# Patient Record
Sex: Female | Born: 1980 | Race: Black or African American | Hispanic: No | Marital: Single
Health system: Southern US, Community
[De-identification: ages and names within clinical notes are randomized; demographics above are authoritative.]

---

## 2007-03-03 ENCOUNTER — Emergency Department (HOSPITAL_COMMUNITY): Admission: EM | Admit: 2007-03-03 | Discharge: 2007-03-03 | Payer: Self-pay | Admitting: Family Medicine

## 2007-03-06 ENCOUNTER — Emergency Department (HOSPITAL_COMMUNITY): Admission: EM | Admit: 2007-03-06 | Discharge: 2007-03-07 | Payer: Self-pay | Admitting: Emergency Medicine

## 2009-10-16 ENCOUNTER — Emergency Department (HOSPITAL_COMMUNITY): Admission: EM | Admit: 2009-10-16 | Discharge: 2009-10-16 | Payer: Self-pay | Admitting: Family Medicine

## 2013-05-02 ENCOUNTER — Encounter (HOSPITAL_COMMUNITY): Payer: Self-pay | Admitting: Emergency Medicine

## 2013-05-02 ENCOUNTER — Emergency Department (HOSPITAL_COMMUNITY)
Admission: EM | Admit: 2013-05-02 | Discharge: 2013-05-02 | Disposition: A | Discharge status: Home / Self Care | Payer: Managed Care, Other (non HMO) | Attending: Emergency Medicine | Admitting: Emergency Medicine

## 2013-05-02 DIAGNOSIS — Z975 Presence of (intrauterine) contraceptive device: Secondary | ICD-10-CM | POA: Insufficient documentation

## 2013-05-02 DIAGNOSIS — R000 Tachycardia, unspecified: Secondary | ICD-10-CM | POA: Insufficient documentation

## 2013-05-02 DIAGNOSIS — Z880 Allergy status to penicillin: Secondary | ICD-10-CM | POA: Insufficient documentation

## 2013-05-02 DIAGNOSIS — J111 Influenza due to unidentified influenza virus with other respiratory manifestations: Secondary | ICD-10-CM | POA: Insufficient documentation

## 2013-05-02 MED ORDER — ACETAMINOPHEN 500 MG PO TABS
1000.0000 mg | ORAL_TABLET | Freq: Once | ORAL | Status: AC
  Administered 2013-05-02: 1000 mg via ORAL
  Filled 2013-05-02: qty 2

## 2013-05-02 MED ORDER — OSELTAMIVIR PHOSPHATE 75 MG PO CAPS: 75.0000 mg | ORAL_CAPSULE | Freq: Two times a day (BID) | ORAL | Status: DC

## 2013-05-02 MED ORDER — PSEUDOEPHEDRINE HCL ER 120 MG PO TB12: 120.0000 mg | ORAL_TABLET | Freq: Two times a day (BID) | ORAL | Status: DC

## 2013-05-02 MED ORDER — IBUPROFEN 200 MG PO TABS
600.0000 mg | ORAL_TABLET | Freq: Once | ORAL | Status: AC
  Administered 2013-05-02: 600 mg via ORAL
  Filled 2013-05-02: qty 3

## 2013-05-02 NOTE — ED Notes (Signed)
Pt report flu like symptoms since last night, sts fever, body aches, cough. 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

## 2013-05-02 NOTE — Discharge Instructions (Signed)

## 2013-05-02 NOTE — ED Provider Notes (Signed)
CSN: [id number]     Arrival date & time 05/02/13  1245 History  This chart was scribed for non-physician practitioner, Molli HazardHannah Tierany Appleby-PA, working with Lyanne CoKevin M Campos, MD by Smiley HousemanFallon Davis, ED Scribe. This patient was seen in room WTR5/WTR5 and the patient's care was started at 1:50 PM.  Chief Complaint  Patient presents with  . flu like symptoms    The history is provided by the patient. No language interpreter was used.   HPI Comments: Lauren FavorsKendra N Mynhier is a 33 y.o. female who presents to the Emergency Department complaining of worsening constant flu-like symptoms that started last night.  Pt has an associated slight productive cough, generalized body aches, rhinorrhea, nasal congestion, subjective fever, and chills.  ED temperature is 100.28F.  She also complains of a HA, that is worse after she coughs.  She reports that she took Mucinex with some relief.  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     History reviewed. No pertinent past medical history. History reviewed. No pertinent past surgical history. No family history on file. History  Substance Use Topics  . Smoking status: Passive Smoke Exposure - Never Smoker  . Smokeless tobacco: Not on file  . Alcohol Use: Yes     Comment: occasionally   Review of Systems  Constitutional: Positive for fever and chills.  HENT: Positive for congestion (Nasal), rhinorrhea and sinus pressure. Negative for ear pain and sore throat.   Respiratory: Positive for cough. Negative for shortness of breath.   Cardiovascular: Negative for chest pain.  Gastrointestinal: Negative for nausea, vomiting, abdominal pain and diarrhea.  Musculoskeletal: Positive for myalgias. Negative for back pain.  Skin: Negative for color change and rash.  Neurological: Positive for headaches. Negative for syncope.  All other systems reviewed and are negative.    Allergies  Penicillins  Home Medications   Current Outpatient Rx  Name  Route   Sig  Dispense  Refill  . guaiFENesin (MUCINEX) 600 MG 12 hr tablet   Oral   Take 600 mg by mouth 2 (two) times daily as needed for cough or to loosen phlegm.         Marland Kitchen. levonorgestrel (MIRENA) 20 MCG/24HR IUD   Intrauterine   1 each by Intrauterine route once.          Triage Vitals: BP 159/91  Pulse 125  Temp(Src) 100.4 F (38 C) (Oral)  Resp 22  SpO2 100% Physical Exam  Nursing note and vitals reviewed. Constitutional: She is oriented to person, place, and time. She appears well-developed and well-nourished. No distress.  HENT:  Head: Normocephalic and atraumatic.  Right Ear: Tympanic membrane, external ear and ear canal normal.  Left Ear: Tympanic membrane, external ear and ear canal normal.  Nose: Right sinus exhibits maxillary sinus tenderness and frontal sinus tenderness. Left sinus exhibits maxillary sinus tenderness and frontal sinus tenderness.  Mouth/Throat: Uvula is midline, oropharynx is clear and moist and mucous membranes are normal. No oropharyngeal exudate, posterior oropharyngeal edema or posterior oropharyngeal erythema.  Eyes: Conjunctivae are normal.  Neck: Normal range of motion.  No nuchal rigidity or meningeal signs  Cardiovascular: Regular rhythm and normal heart sounds.  Tachycardia present.   No murmur heard. Pulmonary/Chest: Effort normal and breath sounds normal. No stridor. No respiratory distress. She has no wheezes. She has no rhonchi. She has no rales.  Abdominal: Soft. She exhibits no distension.  Musculoskeletal: Normal range of motion.  Neurological: She is alert and  oriented to person, place, and time. She has normal strength.  Skin: Skin is warm and dry. She is not diaphoretic. No erythema.  Psychiatric: She has a normal mood and affect. Her behavior is normal.    ED Course  Procedures (including critical care time) DIAGNOSTIC STUDIES: Oxygen Saturation is 100% on RA, normal by my interpretation.    COORDINATION OF CARE: 1:55 PM-Will  order Tylenol.  Will prescribe Tamiflu and Sudafed.  Patient informed of current plan of treatment and evaluation and agrees with plan.    Labs Review Labs Reviewed - No data to display Imaging Review No results found.   MDM   1. Influenza    Patient presents to ED with flu like sx that began yesterday. Fever controlled in ED with tylenol and advil. Patient was initially tachycardic in triage. Pt with pulse of 105 prior to discharge. I suspect this is related to mild dehydration and fever. Patient was given tamiflu and sudafed. Strict return instructions given. Patient is hemodynamically stable prior to discharge. Patient / Family / Caregiver informed of clinical course, understand medical decision-making process, and agree with plan.   I personally performed the services described in this documentation, which was scribed in my presence. The recorded information has been reviewed and is accurate.     Mora Bellman, PA-C 05/02/13 [phone]

## 2013-05-02 NOTE — ED Provider Notes (Signed)
Medical screening examination/treatment/procedure(s) were performed by non-physician practitioner and as supervising physician I was immediately available for consultation/collaboration.  EKG Interpretation   None         Arnika Larzelere M Vaidehi Braddy, MD 05/02/13 1554 

## 2015-07-17 ENCOUNTER — Emergency Department (HOSPITAL_COMMUNITY)
Admission: EM | Admit: 2015-07-17 | Discharge: 2015-07-17 | Disposition: A | Discharge status: Home / Self Care | Payer: Managed Care, Other (non HMO) | Source: Home / Self Care

## 2015-07-17 ENCOUNTER — Encounter (HOSPITAL_COMMUNITY): Payer: Self-pay | Admitting: *Deleted

## 2015-07-17 DIAGNOSIS — S239XXA Sprain of unspecified parts of thorax, initial encounter: Secondary | ICD-10-CM | POA: Diagnosis not present

## 2015-07-17 MED ORDER — IBUPROFEN 800 MG PO TABS: 800.0000 mg | ORAL_TABLET | Freq: Three times a day (TID) | ORAL | Status: DC

## 2015-07-17 MED ORDER — CYCLOBENZAPRINE HCL 5 MG PO TABS: 5.0000 mg | ORAL_TABLET | Freq: Three times a day (TID) | ORAL | Status: DC | PRN

## 2015-07-17 NOTE — ED Provider Notes (Signed)
CSN: [id number]     Arrival date & time 07/17/15  1936 History   None    Chief Complaint  Patient presents with  . Back Pain   (Consider location/radiation/quality/duration/timing/severity/associated sxs/prior Treatment) Patient is a 35 y.o. female presenting with back pain. The history is provided by the patient.  Back Pain Location:  Thoracic spine Quality:  Shooting and stiffness Radiates to:  Does not radiate Pain severity:  Mild Onset quality:  Gradual Duration:  3 days Progression:  Unchanged Chronicity:  New Context comment:  Works at [employer] and denies specific injury but thinks she hurt jer back moving a pt. Relieved by:  None tried Worsened by:  Nothing tried Associated symptoms: no abdominal pain, no abdominal swelling, no dysuria, no fever, no leg pain, no numbness, no paresthesias, no pelvic pain and no tingling     History reviewed. No pertinent past medical history. History reviewed. No pertinent past surgical history. History reviewed. No pertinent family history. Social History  Substance Use Topics  . Smoking status: Passive Smoke Exposure - Never Smoker  . Smokeless tobacco: None  . Alcohol Use: Yes     Comment: occasionally   OB History    No data available     Review of Systems  Constitutional: Negative.  Negative for fever.  HENT: Negative.   Respiratory: Negative.   Cardiovascular: Negative.   Gastrointestinal: Negative for abdominal pain.  Genitourinary: Negative.  Negative for dysuria and pelvic pain.  Musculoskeletal: Positive for myalgias and back pain. Negative for joint swelling and gait problem.  Skin: Negative.   Neurological: Negative for tingling, numbness and paresthesias.  All other systems reviewed and are negative.   Allergies  Penicillins  Home Medications   Prior to Admission medications   Medication Sig Start Date End Date Taking? Authorizing Provider  cyclobenzaprine (FLEXERIL) 5 MG tablet Take 1 tablet (5 mg total) by  mouth 3 (three) times daily as needed for muscle spasms. 07/17/15   Linna HoffJames D Kindl, MD  guaiFENesin (MUCINEX) 600 MG 12 hr tablet Take 600 mg by mouth 2 (two) times daily as needed for cough or to loosen phlegm.    Historical Provider, MD  ibuprofen (ADVIL,MOTRIN) 800 MG tablet Take 1 tablet (800 mg total) by mouth 3 (three) times daily. Prn back pain 07/17/15   Linna HoffJames D Kindl, MD  levonorgestrel (MIRENA) 20 MCG/24HR IUD 1 each by Intrauterine route once.    Historical Provider, MD  oseltamivir (TAMIFLU) 75 MG capsule Take 1 capsule (75 mg total) by mouth every 12 (twelve) hours. 05/02/13   Junious SilkHannah Merrell, PA-C  pseudoephedrine (SUDAFED 12 HOUR) 120 MG 12 hr tablet Take 1 tablet (120 mg total) by mouth 2 (two) times daily. 05/02/13   Junious SilkHannah Merrell, PA-C   Meds Ordered and Administered this Visit  Medications - No data to display  BP 125/69 mmHg  Pulse 80  Temp(Src) 99.2 F (37.3 C) (Oral)  Resp 14  SpO2 100% No data found.   Physical Exam  Constitutional: She is oriented to person, place, and time. She appears well-developed and well-nourished.  Neck: Normal range of motion. Neck supple.  Cardiovascular: Regular rhythm, normal heart sounds and intact distal pulses.   Pulmonary/Chest: Effort normal and breath sounds normal. She exhibits tenderness.  Abdominal: Soft. Bowel sounds are normal.  Musculoskeletal: She exhibits tenderness.       Arms: Lymphadenopathy:    She has no cervical adenopathy.  Neurological: She is alert and oriented to person, place, and time.  Skin: Skin is warm and dry.  Nursing note and vitals reviewed.   ED Course  Procedures (including critical care time)  Labs Review Labs Reviewed - No data to display  Imaging Review No results found.   Visual Acuity Review  Right Eye Distance:   Left Eye Distance:   Bilateral Distance:    Right Eye Near:   Left Eye Near:    Bilateral Near:         MDM   1. Thoracic back sprain, initial encounter         Linna Hoff, MD 07/17/15 2140

## 2015-07-17 NOTE — ED Notes (Signed)
PT  REPORTS  R  SIDED  BACK  PAIN  WITH  PAIN RADIATING  DOWN R   LEG   X  2  DAYS   - DENYS  ANY  SPECEFIC  INJURY      PT  SITTING  UPRIGHT ON THE  EXAM TABLE   SPEAKING IN  COMPLETE  SENTANCES

## 2015-07-24 ENCOUNTER — Encounter (HOSPITAL_COMMUNITY): Payer: Self-pay | Admitting: Emergency Medicine

## 2015-07-24 ENCOUNTER — Emergency Department (HOSPITAL_COMMUNITY)
Admission: EM | Admit: 2015-07-24 | Discharge: 2015-07-24 | Disposition: A | Discharge status: Home / Self Care | Payer: Managed Care, Other (non HMO) | Attending: Emergency Medicine | Admitting: Emergency Medicine

## 2015-07-24 DIAGNOSIS — Z79899 Other long term (current) drug therapy: Secondary | ICD-10-CM | POA: Diagnosis not present

## 2015-07-24 DIAGNOSIS — Y998 Other external cause status: Secondary | ICD-10-CM | POA: Insufficient documentation

## 2015-07-24 DIAGNOSIS — S199XXA Unspecified injury of neck, initial encounter: Secondary | ICD-10-CM | POA: Diagnosis present

## 2015-07-24 DIAGNOSIS — S29002A Unspecified injury of muscle and tendon of back wall of thorax, initial encounter: Secondary | ICD-10-CM | POA: Diagnosis not present

## 2015-07-24 DIAGNOSIS — Y9389 Activity, other specified: Secondary | ICD-10-CM | POA: Diagnosis not present

## 2015-07-24 DIAGNOSIS — Z880 Allergy status to penicillin: Secondary | ICD-10-CM | POA: Diagnosis not present

## 2015-07-24 DIAGNOSIS — Y92410 Unspecified street and highway as the place of occurrence of the external cause: Secondary | ICD-10-CM | POA: Insufficient documentation

## 2015-07-24 DIAGNOSIS — S0990XA Unspecified injury of head, initial encounter: Secondary | ICD-10-CM | POA: Diagnosis not present

## 2015-07-24 DIAGNOSIS — S3992XA Unspecified injury of lower back, initial encounter: Secondary | ICD-10-CM | POA: Insufficient documentation

## 2015-07-24 DIAGNOSIS — S161XXA Strain of muscle, fascia and tendon at neck level, initial encounter: Secondary | ICD-10-CM

## 2015-07-24 MED ORDER — CYCLOBENZAPRINE HCL 5 MG PO TABS: 5.0000 mg | ORAL_TABLET | Freq: Three times a day (TID) | ORAL | Status: DC | PRN

## 2015-07-24 MED ORDER — IBUPROFEN 800 MG PO TABS: 800.0000 mg | ORAL_TABLET | Freq: Three times a day (TID) | ORAL | Status: DC | PRN

## 2015-07-24 NOTE — Discharge Instructions (Signed)
°Cervical Strain and Sprain With Rehab °Cervical strain and sprain are injuries that commonly occur with "whiplash" injuries. Whiplash occurs when the neck is forcefully whipped backward or forward, such as during a motor vehicle accident or during contact sports. The muscles, ligaments, tendons, discs, and nerves of the neck are susceptible to injury when this occurs. °RISK FACTORS °Risk of having a whiplash injury increases if: °· Osteoarthritis of the spine. °· Situations that make head or neck accidents or trauma more likely. °· High-risk sports (football, rugby, wrestling, hockey, auto racing, gymnastics, diving, contact karate, or boxing). °· Poor strength and flexibility of the neck. °· Previous neck injury. °· Poor tackling technique. °· Improperly fitted or padded equipment. °SYMPTOMS  °· Pain or stiffness in the front or back of neck or both. °· Symptoms may present immediately or up to 24 hours after injury. °· Dizziness, headache, nausea, and vomiting. °· Muscle spasm with soreness and stiffness in the neck. °· Tenderness and swelling at the injury site. °PREVENTION °· Learn and use proper technique (avoid tackling with the head, spearing, and head-butting; use proper falling techniques to avoid landing on the head). °· Warm up and stretch properly before activity. °· Maintain physical fitness: °¨ Strength, flexibility, and endurance. °¨ Cardiovascular fitness. °· Wear properly fitted and padded protective equipment, such as padded soft collars, for participation in contact sports. °PROGNOSIS  °Recovery from cervical strain and sprain injuries is dependent on the extent of the injury. These injuries are usually curable in 1 week to 3 months with appropriate treatment.  °RELATED COMPLICATIONS  °· Temporary numbness and weakness may occur if the nerve roots are damaged, and this may persist until the nerve has completely healed. °· Chronic pain due to frequent recurrence of symptoms. °· Prolonged healing,  especially if activity is resumed too soon (before complete recovery). °TREATMENT  °Treatment initially involves the use of ice and medication to help reduce pain and inflammation. It is also important to perform strengthening and stretching exercises and modify activities that worsen symptoms so the injury does not get worse. These exercises may be performed at home or with a therapist. For patients who experience severe symptoms, a soft, padded collar may be recommended to be worn around the neck.  °Improving your posture may help reduce symptoms. Posture improvement includes pulling your chin and abdomen in while sitting or standing. If you are sitting, sit in a firm chair with your buttocks against the back of the chair. While sleeping, try replacing your pillow with a small towel rolled to 2 inches in diameter, or use a cervical pillow or soft cervical collar. Poor sleeping positions delay healing.  °For patients with nerve root damage, which causes numbness or weakness, the use of a cervical traction apparatus may be recommended. Surgery is rarely necessary for these injuries. However, cervical strain and sprains that are present at birth (congenital) may require surgery. °MEDICATION  °· If pain medication is necessary, nonsteroidal anti-inflammatory medications, such as aspirin and ibuprofen, or other minor pain relievers, such as acetaminophen, are often recommended. °· Do not take pain medication for 7 days before surgery. °· Prescription pain relievers may be given if deemed necessary by your caregiver. Use only as directed and only as much as you need. °HEAT AND COLD:  °· Cold treatment (icing) relieves pain and reduces inflammation. Cold treatment should be applied for 10 to 15 minutes every 2 to 3 hours for inflammation and pain and immediately after any activity that aggravates your   HEAT AND COLD:   · Cold treatment (icing) relieves pain and reduces inflammation. Cold treatment should be applied for 10 to 15 minutes every 2 to 3 hours for inflammation and pain and immediately after any activity that aggravates your symptoms. Use ice packs or an ice massage.  · Heat treatment may be used prior to performing the stretching and  strengthening activities prescribed by your caregiver, physical therapist, or athletic trainer. Use a heat pack or a warm soak.  SEEK MEDICAL CARE IF:   · Symptoms get worse or do not improve in 2 weeks despite treatment.  · New, unexplained symptoms develop (drugs used in treatment may produce side effects).  EXERCISES  RANGE OF MOTION (ROM) AND STRETCHING EXERCISES - Cervical Strain and Sprain  These exercises may help you when beginning to rehabilitate your injury. In order to successfully resolve your symptoms, you must improve your posture. These exercises are designed to help reduce the forward-head and rounded-shoulder posture which contributes to this condition. Your symptoms may resolve with or without further involvement from your physician, physical therapist or athletic trainer. While completing these exercises, remember:   · Restoring tissue flexibility helps normal motion to return to the joints. This allows healthier, less painful movement and activity.  · An effective stretch should be held for at least 20 seconds, although you may need to begin with shorter hold times for comfort.  · A stretch should never be painful. You should only feel a gentle lengthening or release in the stretched tissue.  STRETCH- Axial Extensors  · Lie on your back on the floor. You may bend your knees for comfort. Place a rolled-up hand towel or dish towel, about 2 inches in diameter, under the part of your head that makes contact with the floor.  · Gently tuck your chin, as if trying to make a "double chin," until you feel a gentle stretch at the base of your head.  · Hold __________ seconds.  Repeat __________ times. Complete this exercise __________ times per day.   STRETCH - Axial Extension   · Stand or sit on a firm surface. Assume a good posture: chest up, shoulders drawn back, abdominal muscles slightly tense, knees unlocked (if standing) and feet hip width apart.  · Slowly retract your chin so your head slides back  and your chin slightly lowers. Continue to look straight ahead.  · You should feel a gentle stretch in the back of your head. Be certain not to feel an aggressive stretch since this can cause headaches later.  · Hold for __________ seconds.  Repeat __________ times. Complete this exercise __________ times per day.  STRETCH - Cervical Side Bend   · Stand or sit on a firm surface. Assume a good posture: chest up, shoulders drawn back, abdominal muscles slightly tense, knees unlocked (if standing) and feet hip width apart.  · Without letting your nose or shoulders move, slowly tip your right / left ear to your shoulder until your feel a gentle stretch in the muscles on the opposite side of your neck.  · Hold __________ seconds.  Repeat __________ times. Complete this exercise __________ times per day.  STRETCH - Cervical Rotators   · Stand or sit on a firm surface. Assume a good posture: chest up, shoulders drawn back, abdominal muscles slightly tense, knees unlocked (if standing) and feet hip width apart.  · Keeping your eyes level with the ground, slowly turn your head until you feel a gentle stretch along   the back and opposite side of your neck.  · Hold __________ seconds.  Repeat __________ times. Complete this exercise __________ times per day.  RANGE OF MOTION - Neck Circles   · Stand or sit on a firm surface. Assume a good posture: chest up, shoulders drawn back, abdominal muscles slightly tense, knees unlocked (if standing) and feet hip width apart.  · Gently roll your head down and around from the back of one shoulder to the back of the other. The motion should never be forced or painful.  · Repeat the motion 10-20 times, or until you feel the neck muscles relax and loosen.  Repeat __________ times. Complete the exercise __________ times per day.  STRENGTHENING EXERCISES - Cervical Strain and Sprain  These exercises may help you when beginning to rehabilitate your injury. They may resolve your symptoms with or  without further involvement from your physician, physical therapist, or athletic trainer. While completing these exercises, remember:   · Muscles can gain both the endurance and the strength needed for everyday activities through controlled exercises.  · Complete these exercises as instructed by your physician, physical therapist, or athletic trainer. Progress the resistance and repetitions only as guided.  · You may experience muscle soreness or fatigue, but the pain or discomfort you are trying to eliminate should never worsen during these exercises. If this pain does worsen, stop and make certain you are following the directions exactly. If the pain is still present after adjustments, discontinue the exercise until you can discuss the trouble with your clinician.  STRENGTH - Cervical Flexors, Isometric  · Face a wall, standing about 6 inches away. Place a small pillow, a ball about 6-8 inches in diameter, or a folded towel between your forehead and the wall.  · Slightly tuck your chin and gently push your forehead into the soft object. Push only with mild to moderate intensity, building up tension gradually. Keep your jaw and forehead relaxed.  · Hold 10 to 20 seconds. Keep your breathing relaxed.  · Release the tension slowly. Relax your neck muscles completely before you start the next repetition.  Repeat __________ times. Complete this exercise __________ times per day.  STRENGTH- Cervical Lateral Flexors, Isometric   · Stand about 6 inches away from a wall. Place a small pillow, a ball about 6-8 inches in diameter, or a folded towel between the side of your head and the wall.  · Slightly tuck your chin and gently tilt your head into the soft object. Push only with mild to moderate intensity, building up tension gradually. Keep your jaw and forehead relaxed.  · Hold 10 to 20 seconds. Keep your breathing relaxed.  · Release the tension slowly. Relax your neck muscles completely before you start the next  repetition.  Repeat __________ times. Complete this exercise __________ times per day.  STRENGTH - Cervical Extensors, Isometric   · Stand about 6 inches away from a wall. Place a small pillow, a ball about 6-8 inches in diameter, or a folded towel between the back of your head and the wall.  · Slightly tuck your chin and gently tilt your head back into the soft object. Push only with mild to moderate intensity, building up tension gradually. Keep your jaw and forehead relaxed.  · Hold 10 to 20 seconds. Keep your breathing relaxed.  · Release the tension slowly. Relax your neck muscles completely before you start the next repetition.  Repeat __________ times. Complete this exercise __________ times per day.    All of your joints have less wear and tear when properly supported by a spine with good posture. This means you will experience a healthier, less painful body.  Correct posture must be practiced with all of your activities, especially prolonged sitting and standing. Correct posture is as important when doing repetitive low-stress activities (typing) as it is when doing a single heavy-load activity (lifting). PROLONGED STANDING WHILE SLIGHTLY LEANING FORWARD When completing a task that requires you to lean forward while standing in one  place for a long time, place either foot up on a stationary 2- to 4-inch high object to help maintain the best posture. When both feet are on the ground, the low back tends to lose its slight inward curve. If this curve flattens (or becomes too large), then the back and your other joints will experience too much stress, fatigue more quickly, and can cause pain.  RESTING POSITIONS Consider which positions are most painful for you when choosing a resting position. If you have pain with flexion-based activities (sitting, bending, stooping, squatting), choose a position that allows you to rest in a less flexed posture. You would want to avoid curling into a fetal position on your side. If your pain worsens with extension-based activities (prolonged standing, working overhead), avoid resting in an extended position such as sleeping on your stomach. Most people will find more comfort when they rest with their spine in a more neutral position, neither too rounded nor too arched. Lying on a non-sagging bed on your side with a pillow between your knees, or on your back with a pillow under your knees will often provide some relief. Keep in mind, being in any one position for a prolonged period of time, no matter how correct your posture, can still lead to stiffness. WALKING Walk with an upright posture. Your ears, shoulders, and hips should all line up. OFFICE WORK When working at a desk, create an environment that supports good, upright posture. Without extra support, muscles fatigue and lead to excessive strain on joints and other tissues. CHAIR:  A chair should be able to slide under your desk when your back makes contact with the back of the chair. This allows you to work closely.  The chair's height should allow your eyes to be level with the upper part of your monitor and your hands to be slightly lower than your elbows.  Body position:  Your feet should make contact with the floor. If this is not  possible, use a foot rest.  Keep your ears over your shoulders. This will reduce stress on your neck and low back.   This information is not intended to replace advice given to you by your health care provider. Make sure you discuss any questions you have with your health care provider.   Document Released: 04/07/2005 Document Revised: 04/28/2014 Document Reviewed: 07/20/2008 Elsevier Interactive Patient Education 2016 [employer]  Cryotherapy Cryotherapy is when you put ice on your injury. Ice helps lessen pain and puffiness (swelling) after an injury. Ice works the best when you start using it in the first 24 to 48 hours after an injury. HOME CARE  Put a dry or damp towel between the ice pack and your skin.  You may press gently on the ice pack.  Leave the ice on for no more than 10 to 20 minutes at a time.  Check your skin after 5 minutes to make sure your skin is okay.  Rest at least 20 minutes between ice pack  uses.  Stop using ice when your skin loses feeling (numbness).  Do not use ice on someone who cannot tell you when it hurts. This includes small children and people with memory problems (dementia). GET HELP RIGHT AWAY IF:  You have white spots on your skin.  Your skin turns blue or pale.  Your skin feels waxy or hard.  Your puffiness gets worse. MAKE SURE YOU:   Understand these instructions.  Will watch your condition.  Will get help right away if you are not doing well or get worse.   This information is not intended to replace advice given to you by your health care provider. Make sure you discuss any questions you have with your health care provider.   Document Released: 09/24/2007 Document Revised: 06/30/2011 Document Reviewed: 11/28/2010 Elsevier Interactive Patient Education [employer]
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
## 2015-07-24 NOTE — ED Notes (Signed)
PT DISCHARGED. INSTRUCTIONS AND PRESCRIPTIONS GIVEN. AAOX3. PT IN NO APPARENT DISTRESS. THE OPPORTUNITY TO ASK QUESTIONS WAS PROVIDED. 

## 2015-12-03 ENCOUNTER — Ambulatory Visit (HOSPITAL_COMMUNITY)
Admission: EM | Admit: 2015-12-03 | Discharge: 2015-12-03 | Disposition: A | Discharge status: Home / Self Care | Payer: Managed Care, Other (non HMO) | Attending: Family Medicine | Admitting: Family Medicine

## 2015-12-03 ENCOUNTER — Encounter (HOSPITAL_COMMUNITY): Payer: Self-pay | Admitting: Emergency Medicine

## 2015-12-03 DIAGNOSIS — J039 Acute tonsillitis, unspecified: Secondary | ICD-10-CM | POA: Insufficient documentation

## 2015-12-03 DIAGNOSIS — Z880 Allergy status to penicillin: Secondary | ICD-10-CM | POA: Insufficient documentation

## 2015-12-03 DIAGNOSIS — J029 Acute pharyngitis, unspecified: Secondary | ICD-10-CM | POA: Diagnosis present

## 2015-12-03 LAB — POCT RAPID STREP A: Streptococcus, Group A Screen (Direct): NEGATIVE

## 2015-12-03 MED ORDER — AZITHROMYCIN 250 MG PO TABS: 250.0000 mg | ORAL_TABLET | Freq: Every day | ORAL | 0 refills | Status: DC

## 2015-12-03 NOTE — ED Triage Notes (Signed)
[facility] a [facility] sore throat with fever that started 2 days ago.
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
## 2015-12-06 LAB — CULTURE, GROUP A STREP (THRC)

## 2016-04-10 ENCOUNTER — Encounter (HOSPITAL_COMMUNITY): Payer: Self-pay | Admitting: Emergency Medicine

## 2016-04-10 ENCOUNTER — Emergency Department (HOSPITAL_COMMUNITY)
Admission: EM | Admit: 2016-04-10 | Discharge: 2016-04-10 | Disposition: A | Discharge status: Home / Self Care | Payer: Managed Care, Other (non HMO) | Attending: Emergency Medicine | Admitting: Emergency Medicine

## 2016-04-10 ENCOUNTER — Emergency Department (HOSPITAL_COMMUNITY): Payer: Managed Care, Other (non HMO)

## 2016-04-10 DIAGNOSIS — L03011 Cellulitis of right finger: Secondary | ICD-10-CM

## 2016-04-10 DIAGNOSIS — Z7722 Contact with and (suspected) exposure to environmental tobacco smoke (acute) (chronic): Secondary | ICD-10-CM | POA: Insufficient documentation

## 2016-04-10 MED ORDER — BUPIVACAINE HCL (PF) 0.5 % IJ SOLN
10.0000 mL | Freq: Once | INTRAMUSCULAR | Status: AC
  Administered 2016-04-10: 10 mL
  Filled 2016-04-10: qty 30

## 2016-04-10 MED ORDER — LIDOCAINE HCL 2 % IJ SOLN
5.0000 mL | Freq: Once | INTRAMUSCULAR | Status: AC
  Administered 2016-04-10: 100 mg via INTRADERMAL
  Filled 2016-04-10: qty 20

## 2016-04-10 MED ORDER — NAPROXEN 500 MG PO TABS: 500.0000 mg | ORAL_TABLET | Freq: Two times a day (BID) | ORAL | 0 refills | Status: DC

## 2016-04-10 MED ORDER — HYDROCODONE-ACETAMINOPHEN 5-325 MG PO TABS: 1.0000 | ORAL_TABLET | Freq: Four times a day (QID) | ORAL | 0 refills | Status: DC | PRN

## 2016-04-10 MED ORDER — BACITRACIN ZINC 500 UNIT/GM EX OINT
TOPICAL_OINTMENT | Freq: Once | CUTANEOUS | Status: AC
  Administered 2016-04-10: 1 via TOPICAL

## 2016-04-10 MED ORDER — CEPHALEXIN 500 MG PO CAPS: 500.0000 mg | ORAL_CAPSULE | Freq: Four times a day (QID) | ORAL | 0 refills | Status: DC

## 2016-04-10 NOTE — ED Triage Notes (Signed)
Patient here with complaints of right middle finger pain. States that she injured it at work, able to get acrylic nail off with no relief. Pain 7/10.

## 2016-04-10 NOTE — Discharge Instructions (Signed)
You have what is suspected to be an infection in the finger behind the nail. This was drained in the ED. Please take all of your antibiotics until finished!   You may develop abdominal discomfort or diarrhea from the antibiotic.  You may help offset this with probiotics which you can buy or get in yogurt. Do not eat or take the probiotics until 2 hours after your antibiotic.  Keep your wound clean and dry. Follow the attached instructions for further care management details. Return to ED should symptoms worsen.

## 2016-04-10 NOTE — ED Provider Notes (Addendum)
[facility]-EMERGENCY DEPT Provider Note   CSN: [id number] Arrival date & time: 04/10/16  1552  By signing my name below, I, Placido SouLogan Joldersma, attest that this documentation has been prepared under the direction and in the presence of Shawn C. Joy, PA-C. Electronically Signed: Placido SouLogan Joldersma, ED Scribe. 04/10/16. 4:49 PM.   History   Chief Complaint Chief Complaint  Patient presents with  . Finger Injury    HPI HPI Comments: Lauren Lee is a 35 y.o. female who presents to the Emergency Department complaining of sudden onset, moderate, right third finger pain x 6 days. She does not know if the pain is from an infection or from trauma, but first noticed the pain after she accidentally struck the finger on a coffee table. Pt has synthetic nails on her fingers which were placed two days before her finger was struck. She removed the synthetic nail on the affected finger s/p the onset of her pain. She has taken ibuprofen and applied ice to the finger w/o relief. She reports associated swelling, warmth, and redness in the finger. Patient denies fever/chills, neuro deficits, or any other complaints.      The history is provided by the patient and medical records. No language interpreter was used.    History reviewed. No pertinent past medical history.  There are no active problems to display for this patient.   History reviewed. No pertinent surgical history.  OB History    No data available       Home Medications    Prior to Admission medications   Medication Sig Start Date End Date Taking? Authorizing Provider  azithromycin (ZITHROMAX) 250 MG tablet Take 1 tablet (250 mg total) by mouth daily. Take first 2 tablets together, then 1 every day until finished. 12/03/15   Tharon AquasFrank C Patrick, PA  cephALEXin (KEFLEX) 500 MG capsule Take 1 capsule (500 mg total) by mouth 4 (four) times daily. 04/10/16   Shawn C Joy, PA-C  cyclobenzaprine (FLEXERIL) 5 MG tablet Take 1 tablet (5 mg total) by mouth  3 (three) times daily as needed for muscle spasms. 07/24/15   Earley FavorGail Schulz, NP  guaiFENesin (MUCINEX) 600 MG 12 hr tablet Take 600 mg by mouth 2 (two) times daily as needed for cough or to loosen phlegm.    Historical Provider, MD  HYDROcodone-acetaminophen (NORCO/VICODIN) 5-325 MG tablet Take 1 tablet by mouth every 6 (six) hours as needed. 04/10/16   Shawn C Joy, PA-C  ibuprofen (ADVIL,MOTRIN) 800 MG tablet Take 1 tablet (800 mg total) by mouth every 8 (eight) hours as needed. Prn back pain 07/24/15   Earley FavorGail Schulz, NP  levonorgestrel (MIRENA) 20 MCG/24HR IUD 1 each by Intrauterine route once.    Historical Provider, MD  naproxen (NAPROSYN) 500 MG tablet Take 1 tablet (500 mg total) by mouth 2 (two) times daily. 04/10/16   Shawn C Joy, PA-C  oseltamivir (TAMIFLU) 75 MG capsule Take 1 capsule (75 mg total) by mouth every 12 (twelve) hours. 05/02/13   Junious SilkHannah Merrell, PA-C  pseudoephedrine (SUDAFED 12 HOUR) 120 MG 12 hr tablet Take 1 tablet (120 mg total) by mouth 2 (two) times daily. 05/02/13   Junious SilkHannah Merrell, PA-C    Family History No family history on file.  Social History Social History  Substance Use Topics  . Smoking status: Passive Smoke Exposure - Never Smoker  . Smokeless tobacco: Never Used  . Alcohol use Yes     Comment: occasionally     Allergies   Penicillins  Review of Systems Review of Systems  Musculoskeletal: Positive for arthralgias and joint swelling.  Skin: Positive for color change.  Neurological: Negative for weakness and numbness.   Physical Exam Updated Vital Signs BP 124/94 (BP Location: Left Arm)   Pulse 64   Temp 98.3 F (36.8 C) (Oral)   Resp 16   SpO2 100%   Physical Exam  Constitutional: She appears well-developed and well-nourished. No distress.  HENT:  Head: Normocephalic and atraumatic.  Eyes: Conjunctivae are normal.  Neck: Neck supple.  Cardiovascular: Normal rate, regular rhythm and intact distal pulses.   Pulmonary/Chest: Effort normal.    Musculoskeletal: She exhibits tenderness.  Tenderness to the soft tissue surrounding the nail of the right middle finger. Area behind the cuticle is swollen, erythematous, fluctuant, and warm to the touch.  Flexion and extension intact at the DIP and PIP joint.  Neurological: She is alert.  Sensation intact to the distal end of the right middle finger. Strength 5 out of 5 with flexion and extension.  Skin: Skin is warm and dry. Capillary refill takes less than 2 seconds. She is not diaphoretic. There is erythema.  Psychiatric: She has a normal mood and affect. Her behavior is normal.  Nursing note and vitals reviewed.  ED Treatments / Results  Labs (all labs ordered are listed, but only abnormal results are displayed) Labs Reviewed - No data to display  EKG  EKG Interpretation None       Radiology Dg Finger Middle Right  Result Date: 04/10/2016 CLINICAL DATA:  Right mid and distal middle finger pain for 6 days after hitting it on a chair. EXAM: RIGHT MIDDLE FINGER 2+V COMPARISON:  None. FINDINGS: There is no evidence of fracture or dislocation. There is no evidence of arthropathy or other focal bone abnormality. Soft tissues are unremarkable. IMPRESSION: Negative. Electronically Signed   By: Kennith CenterEric  Mansell M.D.   On: 04/10/2016 16:45   Procedures .Nerve Block Date/Time: 04/10/2016 4:49 PM Performed by: Anselm PancoastJOY, SHAWN C Authorized by: Anselm PancoastJOY, SHAWN C   Consent:    Consent obtained:  Verbal   Consent given by:  Patient   Risks discussed:  Unsuccessful block Indications:    Indications:  Procedural anesthesia Location:    Body area:  Upper extremity   Upper extremity nerve blocked: Digital.   Laterality:  Right Pre-procedure details:    Skin preparation:  Povidone-iodine Procedure details (see MAR for exact dosages):    Block needle gauge:  27 G   Anesthetic injected:  Bupivacaine 0.5% w/o epi and lidocaine 2% w/o epi Post-procedure details:    Outcome:  Anesthesia achieved    Patient tolerance of procedure:  Tolerated well, no immediate complications Drain paronychia Date/Time: 04/10/2016 4:51 PM Performed by: Anselm PancoastJOY, SHAWN C Authorized by: Harolyn RutherfordJOY, SHAWN C  Consent: Verbal consent obtained. Risks and benefits: risks, benefits and alternatives were discussed Consent given by: patient Patient consent: the patient's understanding of the procedure matches consent given Patient identity confirmed: verbally with patient Preparation: Patient was prepped and draped in the usual sterile fashion. Local anesthesia used: yes Anesthesia: digital block  Anesthesia: Local anesthesia used: yes Local Anesthetic: bupivacaine 0.5% without epinephrine and lidocaine 2% without epinephrine  Sedation: Patient sedated: no Patient tolerance: Patient tolerated the procedure well with no immediate complications Comments: I&D of the paronychia yielded scant purulent and bloody drainage.     DIAGNOSTIC STUDIES: Oxygen Saturation is 100% on RA, normal by my interpretation.    COORDINATION OF CARE: 4:48 PM Discussed next  steps with pt. Pt verbalized understanding and is agreeable with the plan.    Medications Ordered in ED Medications  bupivacaine (MARCAINE) 0.5 % injection 10 mL (10 mLs Infiltration Given by Other 04/10/16 1652)  lidocaine (XYLOCAINE) 2 % (with pres) injection 100 mg (100 mg Intradermal Given by Other 04/10/16 1651)  bacitracin ointment (1 application Topical Given 04/10/16 1739)     Initial Impression / Assessment and Plan / ED Course  I have reviewed the triage vital signs and the nursing notes.  Pertinent labs & imaging results that were available during my care of the patient were reviewed by me and considered in my medical decision making (see chart for details).  Clinical Course     Patient presents with finger pain. Paronychia suspected. Home care and return precautions discussed. Patient voiced understanding of all instructions and is comfortable with  discharge.     I personally performed the services described in this documentation, which was scribed in my presence. The recorded information has been reviewed and is accurate. Final Clinical Impressions(s) / ED Diagnoses   Final diagnoses:  Paronychia of right middle finger    New Prescriptions Discharge Medication List as of 04/10/2016  5:35 PM    START taking these medications   Details  cephALEXin (KEFLEX) 500 MG capsule Take 1 capsule (500 mg total) by mouth 4 (four) times daily., Starting Thu 04/10/2016, Print    HYDROcodone-acetaminophen (NORCO/VICODIN) 5-325 MG tablet Take 1 tablet by mouth every 6 (six) hours as needed., Starting Thu 04/10/2016, Print    naproxen (NAPROSYN) 500 MG tablet Take 1 tablet (500 mg total) by mouth 2 (two) times daily., Starting Thu 04/10/2016, Print         Anselm Pancoast, PA-C 04/11/16 1138    Pricilla Loveless, MD 04/12/16 2352    Anselm Pancoast, PA-C 04/15/16 2002    Pricilla Loveless, MD 04/17/16 [phone]

## 2017-07-30 ENCOUNTER — Emergency Department (HOSPITAL_COMMUNITY)
Admission: EM | Admit: 2017-07-30 | Discharge: 2017-07-30 | Disposition: A | Discharge status: Home / Self Care | Payer: PRIVATE HEALTH INSURANCE | Attending: Emergency Medicine | Admitting: Emergency Medicine

## 2017-07-30 ENCOUNTER — Encounter (HOSPITAL_COMMUNITY): Payer: Self-pay

## 2017-07-30 DIAGNOSIS — R0981 Nasal congestion: Secondary | ICD-10-CM

## 2017-07-30 DIAGNOSIS — Z79899 Other long term (current) drug therapy: Secondary | ICD-10-CM | POA: Diagnosis not present

## 2017-07-30 DIAGNOSIS — Z7722 Contact with and (suspected) exposure to environmental tobacco smoke (acute) (chronic): Secondary | ICD-10-CM | POA: Insufficient documentation

## 2017-07-30 NOTE — ED Triage Notes (Signed)
Pt presents with c/o nasal congestion and facial pain because of the congestion for the past 3 days. Pt reports she has been taking OTC meds with no relief.

## 2017-07-30 NOTE — ED Provider Notes (Signed)
[facility]-EMERGENCY DEPT Provider Note   CSN: [id number] Arrival date & time: 07/30/17  0025     History   Chief Complaint Chief Complaint  Patient presents with  . Nasal Congestion    HPI Lauren Lee is a 37 y.o. female.  HPI 37 year old African-American female with no pertinent past medical history presents to the emergency department today for evaluation of nasal congestion.  Patient states for the past 3 days she has had significant nasal congestion, rhinorrhea, sinus pressure and pain.  Patient states that this is likely secondary to allergies.  Patient states he has taken several over-the-counter cold and flu medication along with allergy medications with no relief.  Patient states that it is difficult to breathe out of her nose due to the congestion.  Does report some bilateral ear fullness.  She denies any other associated symptoms including fever, chills, sore throat, headache.  No known sick contacts. History reviewed. No pertinent past medical history.  There are no active problems to display for this patient.   History reviewed. No pertinent surgical history.   OB History   None      Home Medications    Prior to Admission medications   Medication Sig Start Date End Date Taking? Authorizing Provider  azithromycin (ZITHROMAX) 250 MG tablet Take 1 tablet (250 mg total) by mouth daily. Take first 2 tablets together, then 1 every day until finished. 12/03/15   Tharon Aquas, PA  cephALEXin (KEFLEX) 500 MG capsule Take 1 capsule (500 mg total) by mouth 4 (four) times daily. 04/10/16   Joy, Shawn C, PA-C  cyclobenzaprine (FLEXERIL) 5 MG tablet Take 1 tablet (5 mg total) by mouth 3 (three) times daily as needed for muscle spasms. 07/24/15   Earley Favor, NP  guaiFENesin (MUCINEX) 600 MG 12 hr tablet Take 600 mg by mouth 2 (two) times daily as needed for cough or to loosen phlegm.    [provider]  HYDROcodone-acetaminophen  (NORCO/VICODIN) 5-325 MG tablet Take 1 tablet by mouth every 6 (six) hours as needed. 04/10/16   Joy, Shawn C, PA-C  ibuprofen (ADVIL,MOTRIN) 800 MG tablet Take 1 tablet (800 mg total) by mouth every 8 (eight) hours as needed. Prn back pain 07/24/15   Earley Favor, NP  levonorgestrel [facility]) 20 MCG/24HR IUD 1 each by Intrauterine route once.    [provider]  naproxen (NAPROSYN) 500 MG tablet Take 1 tablet (500 mg total) by mouth 2 (two) times daily. 04/10/16   Joy, Shawn C, PA-C  oseltamivir (TAMIFLU) 75 MG capsule Take 1 capsule (75 mg total) by mouth every 12 (twelve) hours. 05/02/13   Junious Silk, PA-C  pseudoephedrine (SUDAFED 12 HOUR) 120 MG 12 hr tablet Take 1 tablet (120 mg total) by mouth 2 (two) times daily. 05/02/13   Junious Silk, PA-C    Family History History reviewed. No pertinent family history.  Social History Social History   Tobacco Use  . Smoking status: Passive Smoke Exposure - Never Smoker  . Smokeless tobacco: Never Used  Substance Use Topics  . Alcohol use: Yes    Comment: occasionally  . Drug use: No     Allergies   Penicillins   Review of Systems Review of Systems  All other systems reviewed and are negative.    Physical Exam Updated Vital Signs BP 137/85 (BP Location: Left Arm)   Pulse 73   Temp 98.3 F (36.8 C) (Oral)   Resp 18   SpO2 97%  Physical Exam  Constitutional: She appears well-developed and well-nourished. No distress.  HENT:  Head: Normocephalic and atraumatic.  Right Ear: Tympanic membrane, external ear and ear canal normal.  Left Ear: Tympanic membrane, external ear and ear canal normal.  Nose: Mucosal edema and rhinorrhea present. Right sinus exhibits maxillary sinus tenderness and frontal sinus tenderness. Left sinus exhibits maxillary sinus tenderness and frontal sinus tenderness.  Mouth/Throat: Uvula is midline, oropharynx is clear and moist and mucous membranes are normal.  Eyes: Conjunctivae are normal.  Right eye exhibits no discharge. Left eye exhibits no discharge. No scleral icterus.  Neck: Normal range of motion. Neck supple.  Pulmonary/Chest: Effort normal and breath sounds normal. No stridor. No respiratory distress. She has no wheezes. She has no rales. She exhibits no tenderness.  Musculoskeletal: Normal range of motion.  Neurological: She is alert.  Skin: Skin is warm and dry. Capillary refill takes less than 2 seconds. No pallor.  Psychiatric: Her behavior is normal. Judgment and thought content normal.  Nursing note and vitals reviewed.    ED Treatments / Results  Labs (all labs ordered are listed, but only abnormal results are displayed) Labs Reviewed - No data to display  EKG None  Radiology No results found.  Procedures Procedures (including critical care time)  Medications Ordered in ED Medications - No data to display   Initial Impression / Assessment and Plan / ED Course  I have reviewed the triage vital signs and the nursing notes.  Pertinent labs & imaging results that were available during my care of the patient were reviewed by me and considered in my medical decision making (see chart for details).     Patient presents to the emergency department today for evaluation of nasal congestion.  Ongoing for 3 days.  Reports likely due to allergies.  She is tried over-the-counter medication with no relief.  On exam patient overall well-appearing and nontoxic.  Vital signs are reassuring.  She does have significant nasal congestion with some sinus pressure to palpation.  Patient denies any associated fevers.  Symptoms secondary to allergic rhinitis versus sinusitis.  No indication for antibiotics at this time.  I discussed using Flonase, antihistamines and nasal decongestants.  Also recommended nasal rinses.  Pt is hemodynamically stable, in NAD, & able to ambulate in the ED. Evaluation does not show pathology that would require ongoing emergent intervention or  inpatient treatment. I explained the diagnosis to the patient. Pain has been managed & has no complaints prior to dc. Pt is comfortable with above plan and is stable for discharge at this time. All questions were answered prior to disposition. Strict return precautions for f/u to the ED were discussed. Encouraged follow up with PCP.   Final Clinical Impressions(s) / ED Diagnoses   Final diagnoses:  Nasal congestion    ED Discharge Orders    None       Wallace KellerLeaphart, Jashad Depaula T, PA-C 07/30/17 [phone]    Shaune PollackIsaacs, Cameron, MD 07/30/17 [phone]

## 2017-10-20 ENCOUNTER — Encounter (HOSPITAL_COMMUNITY): Payer: Self-pay | Admitting: Emergency Medicine

## 2017-10-20 ENCOUNTER — Ambulatory Visit (HOSPITAL_COMMUNITY)
Admission: EM | Admit: 2017-10-20 | Discharge: 2017-10-20 | Disposition: A | Discharge status: Home / Self Care | Payer: Worker's Compensation | Attending: Physician Assistant | Admitting: Physician Assistant

## 2017-10-20 DIAGNOSIS — M545 Low back pain, unspecified: Secondary | ICD-10-CM

## 2017-10-20 MED ORDER — NAPROXEN 500 MG PO TABS: 500.0000 mg | ORAL_TABLET | Freq: Two times a day (BID) | ORAL | 0 refills | Status: AC

## 2017-10-20 MED ORDER — CYCLOBENZAPRINE HCL 10 MG PO TABS: 5.0000 mg | ORAL_TABLET | Freq: Three times a day (TID) | ORAL | 0 refills | Status: DC | PRN

## 2017-10-20 MED ORDER — HYDROCODONE-ACETAMINOPHEN 5-325 MG PO TABS: 1.0000 | ORAL_TABLET | Freq: Four times a day (QID) | ORAL | 0 refills | Status: DC | PRN

## 2017-10-20 NOTE — ED Provider Notes (Signed)
10/20/2017 5:33 PM   DOB: [birth year] / MRN: [id number]  SUBJECTIVE:  Lauren FavorsKendra N Lee is a 37 y.o. female presenting for left-sided lumbar back pain present for 3 days and worsening.  Position changes make the pain worse.  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  She denies weakness in the legs, paresthesia, saddle paresthesia.  She is currently taking birth control.  She is allergic to penicillins.   She  has no past medical history on file.    She  reports that she is a non-smoker but has been exposed to tobacco smoke. She has never used smokeless tobacco. She reports that she drinks alcohol. She reports that she does not use drugs. She  has no sexual activity history on file. The patient  has no past surgical history on file.  Her family history is not on file.  ROS per HPI  OBJECTIVE:  BP (!) 138/91   Pulse 82   Temp 99.2 F (37.3 C) (Temporal)   Resp 16   Wt 157 lb (71.2 kg)   SpO2 98%   Wt Readings from Last 3 Encounters:  10/20/17 157 lb (71.2 kg)   Temp Readings from Last 3 Encounters:  10/20/17 99.2 F (37.3 C) (Temporal)  07/30/17 98.3 F (36.8 C) (Oral)  04/10/16 98.3 F (36.8 C) (Oral)   BP Readings from Last 3 Encounters:  10/20/17 (!) 138/91  07/30/17 137/85  04/10/16 124/94   Pulse Readings from Last 3 Encounters:  10/20/17 82  07/30/17 73  04/10/16 64    Physical Exam  Constitutional: She is oriented to person, place, and time. She appears well-developed.  Eyes: Pupils are equal, round, and reactive to light. EOM are normal.  Cardiovascular: Normal rate.  Pulmonary/Chest: Effort normal.  Abdominal: She exhibits no distension.  Musculoskeletal: Normal range of motion. She exhibits tenderness ( per HPI left-sided lumbar paraspinal tenderness.  No mass or rash.  Reflexes 2+ at the Achilles tendons.  Gait antalgic but otherwise normal.).  Neurological: She is alert and oriented to person, place, and time. No cranial nerve deficit.   Skin: Skin is warm and dry. She is not diaphoretic.  Psychiatric: She has a normal mood and affect.  Vitals reviewed.   No results found for this or any previous visit (from the past 72 hour(s)).  No results found.  ASSESSMENT AND PLAN:   Lumbar back pain:Uncomplicated.  NSAID, Flexeril, Norco short course.  Discharge Instructions   None        The patient is advised to call or return to clinic if she does not see an improvement in symptoms, or to seek the care of the closest emergency department if she worsens with the above plan.   Deliah BostonMichael Jasiel Belisle, MHS, PA-C 10/20/2017 5:33 PM   Ofilia Neaslark, Arilynn Blakeney L, PA-C 10/20/17 1734

## 2017-10-20 NOTE — ED Triage Notes (Signed)
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 PT reports back is "catching" with certain movements. Initial problem started 10/15/17.

## 2017-11-11 IMAGING — DX DG FINGER MIDDLE 2+V*R*
3 series · 3 of 3 positions shown · non-contrast
Comparison: None.

CLINICAL DATA: Right mid and distal middle finger pain for 6 days
after hitting it on a chair.

EXAM:
RIGHT MIDDLE FINGER 2+V

[finger ap]
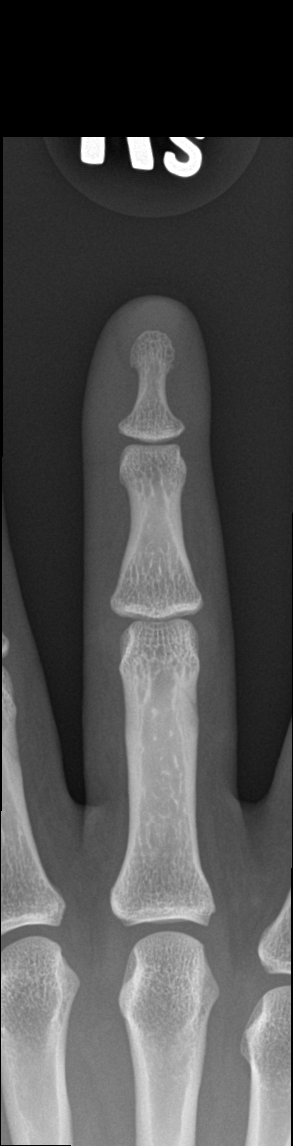

[finger obl]
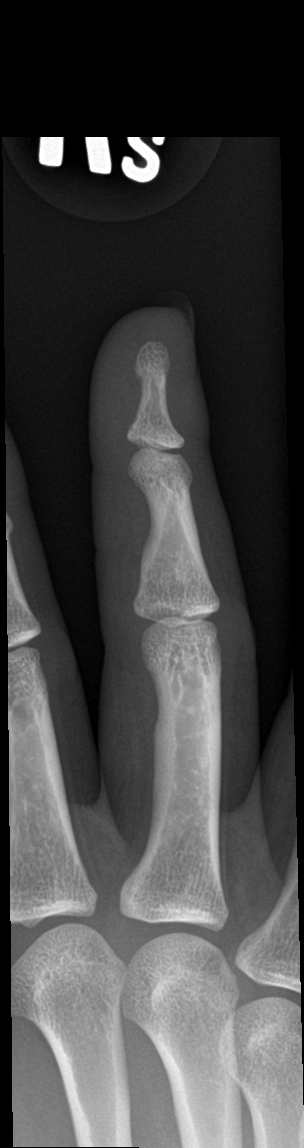

[finger lat]
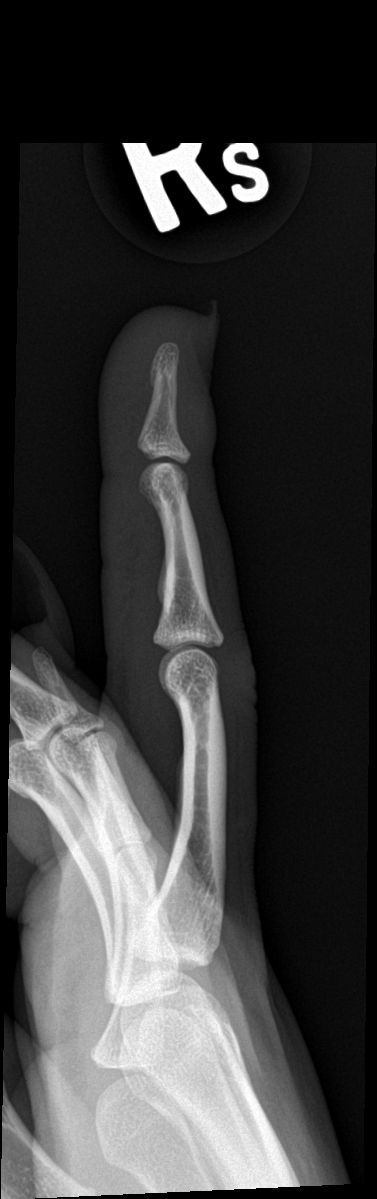

[3 of 3 positions shown; findings below may reference images not displayed]

FINDINGS: There is no evidence of fracture or dislocation. There is no
evidence of arthropathy or other focal bone abnormality. Soft
tissues are unremarkable.
IMPRESSION: Negative.

## 2019-06-01 ENCOUNTER — Encounter (HOSPITAL_COMMUNITY): Payer: Self-pay

## 2019-06-01 ENCOUNTER — Other Ambulatory Visit: Payer: Self-pay

## 2019-06-01 ENCOUNTER — Ambulatory Visit (HOSPITAL_COMMUNITY)
Admission: EM | Admit: 2019-06-01 | Discharge: 2019-06-01 | Disposition: A | Discharge status: Home / Self Care | Payer: Self-pay | Attending: Urgent Care | Admitting: Urgent Care

## 2019-06-01 DIAGNOSIS — Z793 Long term (current) use of hormonal contraceptives: Secondary | ICD-10-CM | POA: Insufficient documentation

## 2019-06-01 DIAGNOSIS — J029 Acute pharyngitis, unspecified: Secondary | ICD-10-CM | POA: Insufficient documentation

## 2019-06-01 DIAGNOSIS — Z880 Allergy status to penicillin: Secondary | ICD-10-CM | POA: Insufficient documentation

## 2019-06-01 DIAGNOSIS — Z20822 Contact with and (suspected) exposure to covid-19: Secondary | ICD-10-CM | POA: Insufficient documentation

## 2019-06-01 DIAGNOSIS — H9201 Otalgia, right ear: Secondary | ICD-10-CM | POA: Insufficient documentation

## 2019-06-01 LAB — POCT RAPID STREP A: Streptococcus, Group A Screen (Direct): NEGATIVE

## 2019-06-01 MED ORDER — AMOXICILLIN-POT CLAVULANATE 875-125 MG PO TABS: 1.0000 | ORAL_TABLET | Freq: Two times a day (BID) | ORAL | 0 refills | Status: AC

## 2019-06-01 MED ORDER — DEXAMETHASONE SODIUM PHOSPHATE 10 MG/ML IJ SOLN
10.0000 mg | Freq: Once | INTRAMUSCULAR | Status: AC
  Administered 2019-06-01: 10 mg via INTRAMUSCULAR

## 2019-06-01 MED ORDER — DEXAMETHASONE SODIUM PHOSPHATE 10 MG/ML IJ SOLN
INTRAMUSCULAR | Status: AC
  Filled 2019-06-01: qty 1

## 2019-06-01 NOTE — Discharge Instructions (Addendum)
Treating you for infection and throat inflammation, possible peritonsillar abscess Steroid injection given here today.  Antibiotics sent to the pharmacy. Start these today.  You can take ibuprofen 600 mg every 8 hours for pain  Also start taking zyrtec daily.  Contact given for ear, nose and throat specialist.  Contact them as needed if symptoms worsen  or go to the ER

## 2019-06-01 NOTE — ED Triage Notes (Signed)
Pt reports having right ear pani and sore throat x 2 days.

## 2019-06-02 NOTE — ED Provider Notes (Signed)
[facility]    CSN: [id number] Arrival date & time: 06/01/19  1342      History   Chief Complaint Chief Complaint  Patient presents with  . Otalgia  . Sore Throat    HPI Lauren Lee is a 39 y.o. female.   Patient is a 39 year old female presents today with right ear pain and right sore throat.  Pain with swallowing. This is been present and worsening over the past 2-day.  She has had some mild low-grade fevers.  She has been taking Tylenol for her discomfort with some relief.  Denies any cough, chest congestion, nasal congestion, rhinorrhea, chest pain or shortness of breath.  Denies any trouble swallowing or trouble breathing.  She is able to swallow foods and liquids.  ROS per HPI      History reviewed. No pertinent past medical history.  There are no problems to display for this patient.   History reviewed. No pertinent surgical history.  OB History   No obstetric history on file.      Home Medications    Prior to Admission medications   Medication Sig Start Date End Date Taking? Authorizing Provider  amoxicillin-clavulanate (AUGMENTIN) 875-125 MG tablet Take 1 tablet by mouth every 12 (twelve) hours. 06/01/19   Janace Aris, NP  levonorgestrel (MIRENA) 20 MCG/24HR IUD 1 each by Intrauterine route once.    [provider]    Family History History reviewed. No pertinent family history.  Social History Social History   Tobacco Use  . Smoking status: Passive Smoke Exposure - Never Smoker  . Smokeless tobacco: Never Used  Substance Use Topics  . Alcohol use: Yes    Comment: occasionally  . Drug use: No     Allergies   Penicillins   Review of Systems Review of Systems   Physical Exam Triage Vital Signs ED Triage Vitals  Enc Vitals Group     BP --      Pulse --      Resp 06/01/19 1420 16     Temp 06/01/19 1420 99.3 F (37.4 C)     Temp Source 06/01/19 1420 Oral     SpO2 --      Weight --      Height --      Head  Circumference --      Peak Flow --      Pain Score 06/01/19 1419 8     Pain Loc --      Pain Edu? --      Excl. in [place]? --    No data found.  Updated Vital Signs Temp 99.3 F (37.4 C) (Oral)   Resp 16   Visual Acuity Right Eye Distance:   Left Eye Distance:   Bilateral Distance:    Right Eye Near:   Left Eye Near:    Bilateral Near:     Physical Exam Vitals and nursing note reviewed.  Constitutional:      General: She is not in acute distress.    Appearance: She is well-developed. She is not ill-appearing, toxic-appearing or diaphoretic.  HENT:     Head: Normocephalic and atraumatic.     Right Ear: Tympanic membrane and ear canal normal.     Left Ear: Tympanic membrane and ear canal normal.     Mouth/Throat:     Pharynx: Pharyngeal swelling present.     Tonsils: Tonsillar exudate present. 3+ on the right. 2+ on the left.     Comments: Mild  uvular deviation to the left. Eyes:     Conjunctiva/sclera: Conjunctivae normal.  Pulmonary:     Effort: Pulmonary effort is normal.  Lymphadenopathy:     Cervical: No cervical adenopathy.  Skin:    General: Skin is warm and dry.  Neurological:     Mental Status: She is alert.  Psychiatric:        Mood and Affect: Mood normal.      UC Treatments / Results  Labs (all labs ordered are listed, but only abnormal results are displayed) Labs Reviewed  NOVEL CORONAVIRUS, NAA (HOSP ORDER, SEND-OUT TO REF LAB; TAT 18-24 HRS)  CULTURE, GROUP A STREP [facility])  POCT RAPID STREP A    EKG   Radiology No results found.  Procedures Procedures (including critical care time)  Medications Ordered in UC Medications  dexamethasone (DECADRON) injection 10 mg (10 mg Intramuscular Given 06/01/19 1515)    Initial Impression / Assessment and Plan / UC Course  I have reviewed the triage vital signs and the nursing notes.  Pertinent labs & imaging results that were available during my care of the patient were reviewed by me and  considered in my medical decision making (see chart for details).      Sore throat-based on exam most likely diagnosis peritonsillar abscess.  Treated with dexamethasone injection here in clinic.  Will send home with Augmentin to take for antibiotic coverage. She can take Tylenol or ibuprofen for pain Recommended if symptoms continue or worsen she will need to go to the ER.  Otherwise she can follow-up with ear nose and throat specialist for any future problems. Final Clinical Impressions(s) / UC Diagnoses   Final diagnoses:  Sore throat     Discharge Instructions     Treating you for infection and throat inflammation, possible peritonsillar abscess Steroid injection given here today.  Antibiotics sent to the pharmacy. Start these today.  You can take ibuprofen 600 mg every 8 hours for pain  Also start taking zyrtec daily.  Contact given for ear, nose and throat specialist.  Contact them as needed if symptoms worsen  or go to the ER       ED Prescriptions    Medication Sig Dispense Auth. Provider   amoxicillin-clavulanate (AUGMENTIN) 875-125 MG tablet Take 1 tablet by mouth every 12 (twelve) hours. 14 tablet Caliana Spires A, NP     PDMP not reviewed this encounter.   Loura Halt A, NP 06/02/19 1102

## 2019-06-03 LAB — NOVEL CORONAVIRUS, NAA (HOSP ORDER, SEND-OUT TO REF LAB; TAT 18-24 HRS): SARS-CoV-2, NAA: NOT DETECTED

## 2019-06-05 LAB — CULTURE, GROUP A STREP (THRC)

## 2019-11-13 ENCOUNTER — Other Ambulatory Visit: Payer: Self-pay

## 2019-11-13 ENCOUNTER — Ambulatory Visit (HOSPITAL_COMMUNITY)
Admission: EM | Admit: 2019-11-13 | Discharge: 2019-11-13 | Disposition: A | Discharge status: Home / Self Care | Payer: Commercial Managed Care - PPO | Attending: Family Medicine | Admitting: Family Medicine

## 2019-11-13 ENCOUNTER — Encounter (HOSPITAL_COMMUNITY): Payer: Self-pay

## 2019-11-13 DIAGNOSIS — M79642 Pain in left hand: Secondary | ICD-10-CM

## 2019-11-13 DIAGNOSIS — R200 Anesthesia of skin: Secondary | ICD-10-CM

## 2019-11-13 MED ORDER — IBUPROFEN 600 MG PO TABS: 600.0000 mg | ORAL_TABLET | Freq: Four times a day (QID) | ORAL | 0 refills | Status: AC | PRN

## 2019-11-13 MED ORDER — PREDNISONE 10 MG (21) PO TBPK: ORAL_TABLET | Freq: Every day | ORAL | 0 refills | Status: AC

## 2019-11-13 MED ORDER — CYCLOBENZAPRINE HCL 10 MG PO TABS: 10.0000 mg | ORAL_TABLET | Freq: Two times a day (BID) | ORAL | 0 refills | Status: AC | PRN

## 2019-11-13 NOTE — Discharge Instructions (Signed)
I have sent in a prednisone taper for you to take for 6 days. 6 tablets on day one, 5 tablets on day two, 4 tablets on day three, 3 tablets on day four, 2 tablets on day five, and 1 tablet on day six.  I have also sent in ibuprofen for you to take every 6 hours as needed for pain and inflammation  I have also sent in cyclobenzaprine for you to use twice daily as needed for muscle spasms  Sleep in a wrist brace to help keep your hand and wrist in a neutral position  Follow up with this office or with primary care as needed  Follow up with the ER for loss of sensation, muscle weakness, trouble swallowing, trouble breathing and other concerning symptoms

## 2019-11-13 NOTE — ED Triage Notes (Signed)
Pt present left hand numbness and pain. Symptoms started two days ago. Pt states she has a habit of sleeping on her hand and the pain becomes unbearable.

## 2019-11-17 NOTE — ED Provider Notes (Signed)
[facility]   [id number] 11/13/19 Arrival Time: 1404  [id number] PAIN  SUBJECTIVE: History from: patient. Lauren Lee is a 39 y.o. female complains of left hand pain and numbness/tingling intermittently for the last few months. Reports that the pain has gotten worse over the last 2 days. Reports that she sleeps on her hand and this makes the pain much worse. Has tried OTC medications without relief. Denies similar symptoms in the past.  Denies fever, chills, erythema, ecchymosis, effusion, weakness, saddle paresthesias, loss of bowel or bladder function.      ROS: As per HPI.  All other pertinent ROS negative.     History reviewed. No pertinent past medical history. History reviewed. No pertinent surgical history. Allergies  Allergen Reactions  . Penicillins Other (See Comments)    Unsure, was told as a child   No current facility-administered medications on file prior to encounter.   Current Outpatient Medications on File Prior to Encounter  Medication Sig Dispense Refill  . amoxicillin-clavulanate (AUGMENTIN) 875-125 MG tablet Take 1 tablet by mouth every 12 (twelve) hours. 14 tablet 0  . levonorgestrel (MIRENA) 20 MCG/24HR IUD 1 each by Intrauterine route once.     Social History   Socioeconomic History  . Marital status: Single    Spouse name: Not on file  . Number of children: Not on file  . Years of education: Not on file  . Highest education level: Not on file  Occupational History  . Not on file  Tobacco Use  . Smoking status: Passive Smoke Exposure - Never Smoker  . Smokeless tobacco: Never Used  Substance and Sexual Activity  . Alcohol use: Yes    Comment: occasionally  . Drug use: No  . Sexual activity: Not on file  Other Topics Concern  . Not on file  Social History Narrative  . Not on file   Social Determinants of Health   Financial Resource Strain:   . Difficulty of Paying Living Expenses:   Food Insecurity:   . Worried About Patent examiner in the Last Year:   . Barista in the Last Year:   Transportation Needs:   . Freight forwarder (Medical):   Marland Kitchen Lack of Transportation (Non-Medical):   Physical Activity:   . Days of Exercise per Week:   . Minutes of Exercise per Session:   Stress:   . Feeling of Stress :   Social Connections:   . Frequency of Communication with Friends and Family:   . Frequency of Social Gatherings with Friends and Family:   . Attends Religious Services:   . Active Member of Clubs or Organizations:   . Attends Banker Meetings:   Marland Kitchen Marital Status:   Intimate Partner Violence:   . Fear of Current or Ex-Partner:   . Emotionally Abused:   Marland Kitchen Physically Abused:   . Sexually Abused:    History reviewed. No pertinent family history.  OBJECTIVE:  Vitals:   11/13/19 1510  BP: (!) 132/84  Pulse: 92  Resp: 16  Temp: 98.2 F (36.8 C)  TempSrc: Oral  SpO2: 100%    General appearance: ALERT; in no acute distress.  Head: NCAT Lungs: Normal respiratory effort CV:  pulses 2+ bilaterally. Cap refill < 2 seconds Musculoskeletal:  Inspection: Skin warm, dry, clear and intact without obvious erythema, effusion, or ecchymosis.  Palpation: Nontender to palpation ROM: FROM active and passive Skin: warm and dry Neurologic: Ambulates without difficulty; Sensation intact about  the upper/ lower extremities Psychological: alert and cooperative; normal mood and affect  DIAGNOSTIC STUDIES:  No results found.   ASSESSMENT & PLAN:  1. Left hand pain   2. Numbness and tingling in left hand      Meds ordered this encounter  Medications  . predniSONE (STERAPRED UNI-PAK 21 TAB) 10 MG (21) TBPK tablet    Sig: Take by mouth daily for 6 days. Take 6 tablets on day 1, 5 tablets on day 2, 4 tablets on day 3, 3 tablets on day 4, 2 tablets on day 5, 1 tablet on day 6    Dispense:  21 tablet    Refill:  0    Order Specific Question:   Supervising Provider    Answer:   Merrilee Jansky [id number]  . cyclobenzaprine (FLEXERIL) 10 MG tablet    Sig: Take 1 tablet (10 mg total) by mouth 2 (two) times daily as needed for muscle spasms.    Dispense:  20 tablet    Refill:  0    Order Specific Question:   Supervising Provider    Answer:   Merrilee Jansky [id number]  . ibuprofen (ADVIL) 600 MG tablet    Sig: Take 1 tablet (600 mg total) by mouth every 6 (six) hours as needed.    Dispense:  30 tablet    Refill:  0    Order Specific Question:   Supervising Provider    Answer:   Merrilee Jansky [id number]    Prescribed ibuprofen Prescribed cyclobenzaprine Prescribed prednisone taper Continue conservative management of rest, ice, and gentle stretches Take ibuprofen as needed for pain relief (may cause abdominal discomfort, ulcers, and GI bleeds avoid taking with other NSAIDs) Take cyclobenzaprine at nighttime for symptomatic relief. Avoid driving or operating heavy machinery while using medication. Follow up with PCP if symptoms persist Return or go to the ER if you have any new or worsening symptoms (fever, chills, chest pain, abdominal pain, changes in bowel or bladder habits, pain radiating into lower legs)   Reviewed expectations re: course of current medical issues. Questions answered. Outlined signs and symptoms indicating need for more acute intervention. Patient verbalized understanding. After Visit Summary given.       Moshe Cipro, NP 11/17/19 [phone]

## 2022-03-01 ENCOUNTER — Ambulatory Visit
Admission: EM | Admit: 2022-03-01 | Discharge: 2022-03-01 | Disposition: A | Discharge status: Home / Self Care | Payer: No Typology Code available for payment source | Attending: Physician Assistant | Admitting: Physician Assistant

## 2022-03-01 ENCOUNTER — Encounter: Payer: Self-pay | Admitting: Emergency Medicine

## 2022-03-01 DIAGNOSIS — Z202 Contact with and (suspected) exposure to infections with a predominantly sexual mode of transmission: Secondary | ICD-10-CM | POA: Insufficient documentation

## 2022-03-01 MED ORDER — CEFTRIAXONE SODIUM 250 MG IJ SOLR
500.0000 mg | Freq: Once | INTRAMUSCULAR | Status: AC
  Administered 2022-03-01: 500 mg via INTRAMUSCULAR

## 2022-03-01 MED ORDER — DOXYCYCLINE HYCLATE 100 MG PO CAPS: 100.0000 mg | ORAL_CAPSULE | Freq: Two times a day (BID) | ORAL | 0 refills | Status: AC

## 2022-03-01 NOTE — ED Triage Notes (Signed)
Per pt she was informed yesterday that her partner was dx with gonorrhea. No symptoms. Has not been interment with him in 2 weeks.

## 2022-03-01 NOTE — Discharge Instructions (Addendum)
Return if any problems.

## 2022-03-01 NOTE — ED Provider Notes (Signed)
[facility]    CSN: [id number] Arrival date & time: 03/01/22  1446      History   Chief Complaint Chief Complaint  Patient presents with   Exposure to STD    HPI Lauren Lee is a 41 y.o. female.   The history is provided by the patient. No language interpreter was used.  Exposure to STD This is a new problem. The problem occurs constantly. The problem has not changed since onset.Nothing aggravates the symptoms. Nothing relieves the symptoms. She has tried nothing for the symptoms. The treatment provided no relief.    History reviewed. No pertinent past medical history.  There are no problems to display for this patient.   History reviewed. No pertinent surgical history.  OB History   No obstetric history on file.      Home Medications    Prior to Admission medications   Medication Sig Start Date End Date Taking? Authorizing Provider  doxycycline (VIBRAMYCIN) 100 MG capsule Take 1 capsule (100 mg total) by mouth 2 (two) times daily. 03/01/22  Yes Caryl Ada K, PA-C  amoxicillin-clavulanate (AUGMENTIN) 875-125 MG tablet Take 1 tablet by mouth every 12 (twelve) hours. 06/01/19   Loura Halt A, NP  cyclobenzaprine (FLEXERIL) 10 MG tablet Take 1 tablet (10 mg total) by mouth 2 (two) times daily as needed for muscle spasms. 11/13/19   Faustino Congress, NP  ibuprofen (ADVIL) 600 MG tablet Take 1 tablet (600 mg total) by mouth every 6 (six) hours as needed. 11/13/19   Faustino Congress, NP  levonorgestrel (MIRENA) 20 MCG/24HR IUD 1 each by Intrauterine route once.    [provider]    Family History History reviewed. No pertinent family history.  Social History Social History   Tobacco Use   Smoking status: Passive Smoke Exposure - Never Smoker   Smokeless tobacco: Never  Substance Use Topics   Alcohol use: Yes    Comment: occasionally   Drug use: No     Allergies   Penicillins   Review of Systems Review of Systems  All  other systems reviewed and are negative.    Physical Exam Triage Vital Signs ED Triage Vitals  Enc Vitals Group     BP 03/01/22 1526 114/84     Pulse Rate 03/01/22 1526 87     Resp 03/01/22 1526 16     Temp 03/01/22 1526 98.7 F (37.1 C)     Temp Source 03/01/22 1526 Oral     SpO2 03/01/22 1526 97 %     Weight --      Height --      Head Circumference --      Peak Flow --      Pain Score 03/01/22 1525 0     Pain Loc --      Pain Edu? --      Excl. in [place]? --    No data found.  Updated Vital Signs BP 114/84 (BP Location: Left Arm)   Pulse 87   Temp 98.7 F (37.1 C) (Oral)   Resp 16   SpO2 97%   Visual Acuity Right Eye Distance:   Left Eye Distance:   Bilateral Distance:    Right Eye Near:   Left Eye Near:    Bilateral Near:     Physical Exam Vitals reviewed.  Constitutional:      Appearance: She is well-developed.  HENT:     Head: Normocephalic and atraumatic.  Eyes:     Conjunctiva/sclera: Conjunctivae  normal.     Pupils: Pupils are equal, round, and reactive to light.  Cardiovascular:     Rate and Rhythm: Normal rate.  Pulmonary:     Effort: Pulmonary effort is normal.  Abdominal:     Palpations: Abdomen is soft.     Tenderness: There is no abdominal tenderness.  Genitourinary:    Comments: Vaginal discharge,  Thick white,  Adnexa no masses,  Cervix nontender Musculoskeletal:        General: Normal range of motion.     Cervical back: Normal range of motion and neck supple.  Skin:    General: Skin is warm.  Neurological:     General: No focal deficit present.     Mental Status: She is alert.  Psychiatric:        Mood and Affect: Mood normal.      UC Treatments / Results  Labs (all labs ordered are listed, but only abnormal results are displayed) Labs Reviewed  CERVICOVAGINAL ANCILLARY ONLY    EKG   Radiology No results found.  Procedures Procedures (including critical care time)  Medications Ordered in UC Medications   cefTRIAXone (ROCEPHIN) injection 500 mg (has no administration in time range)    Initial Impression / Assessment and Plan / UC Course  I have reviewed the triage vital signs and the nursing notes.  Pertinent labs & imaging results that were available during my care of the patient were reviewed by me and considered in my medical decision making (see chart for details).      Final Clinical Impressions(s) / UC Diagnoses   Final diagnoses:  STD exposure     Discharge Instructions      Return if any problems.    ED Prescriptions     Medication Sig Dispense Auth. Provider   doxycycline (VIBRAMYCIN) 100 MG capsule Take 1 capsule (100 mg total) by mouth 2 (two) times daily. 20 capsule Elson Areas, [place]      PDMP not reviewed this encounter.   Elson Areas, [place] 03/01/22 1606

## 2022-03-03 LAB — CERVICOVAGINAL ANCILLARY ONLY
Bacterial Vaginitis (gardnerella): POSITIVE — AB
Candida Glabrata: NEGATIVE
Candida Vaginitis: NEGATIVE
Chlamydia: NEGATIVE
Comment: NEGATIVE
Comment: NEGATIVE
Comment: NEGATIVE
Comment: NEGATIVE
Comment: NEGATIVE
Comment: NORMAL
Neisseria Gonorrhea: NEGATIVE
Trichomonas: NEGATIVE

## 2022-03-04 ENCOUNTER — Telehealth (HOSPITAL_COMMUNITY): Payer: Self-pay | Admitting: Emergency Medicine

## 2022-03-04 MED ORDER — METRONIDAZOLE 0.75 % VA GEL: 1.0000 | Freq: Every day | VAGINAL | 0 refills | Status: AC

## 2023-06-09 ENCOUNTER — Ambulatory Visit
Admission: EM | Admit: 2023-06-09 | Discharge: 2023-06-09 | Disposition: A | Discharge status: Home / Self Care | Payer: 59 | Attending: Family Medicine | Admitting: Family Medicine

## 2023-06-09 DIAGNOSIS — J22 Unspecified acute lower respiratory infection: Secondary | ICD-10-CM

## 2023-06-09 LAB — POC COVID19/FLU A&B COMBO
Covid Antigen, POC: NEGATIVE
Influenza A Antigen, POC: NEGATIVE
Influenza B Antigen, POC: NEGATIVE

## 2023-06-09 MED ORDER — PREDNISONE 20 MG PO TABS: 20.0000 mg | ORAL_TABLET | Freq: Two times a day (BID) | ORAL | 0 refills | Status: AC

## 2023-06-09 MED ORDER — PROMETHAZINE-DM 6.25-15 MG/5ML PO SYRP: 5.0000 mL | ORAL_SOLUTION | Freq: Three times a day (TID) | ORAL | 0 refills | Status: AC | PRN

## 2023-06-09 MED ORDER — ALBUTEROL SULFATE HFA 108 (90 BASE) MCG/ACT IN AERS
2.0000 | INHALATION_SPRAY | Freq: Four times a day (QID) | RESPIRATORY_TRACT | Status: DC | PRN
  Administered 2023-06-09: 2 via RESPIRATORY_TRACT

## 2023-06-09 MED ORDER — AMOXICILLIN-POT CLAVULANATE 875-125 MG PO TABS: 1.0000 | ORAL_TABLET | Freq: Two times a day (BID) | ORAL | 0 refills | Status: AC

## 2023-06-09 NOTE — ED Triage Notes (Signed)
 Pt presents today with flu like symptoms that onset 06/05/2023. Pt states she has had fever and chills and extreme coughing that is causing her chest to hurt.

## 2023-06-09 NOTE — ED Provider Notes (Signed)
 [facility]    CSN: [id number] Arrival date & time: 06/09/23  1024      History   Chief Complaint Chief Complaint  Patient presents with   Chills   Headache   Cough   Fever   Generalized Body Aches    HPI Lauren Lee is a 43 y.o. female.   Patient presents today with flulike symptoms of chills, headache, cough, fever and generalized bodyaches.  Patient endorses that her cough is the most worrisome symptoms which is causing her to experience some chest tightness due to the persistency of the cough.  Cough is nonproductive.  Patient has no history of chronic lung disease.  Patient has taken over-the-counter medication without significant improvement of symptoms No past medical history on file.  There are no active problems to display for this patient.   No past surgical history on file.  OB History   No obstetric history on file.      Home Medications    Prior to Admission medications   Medication Sig Start Date End Date Taking? Authorizing Provider  amoxicillin-clavulanate (AUGMENTIN) 875-125 MG tablet Take 1 tablet by mouth every 12 (twelve) hours. 06/09/23  Yes Bing Neighbors, NP  predniSONE (DELTASONE) 20 MG tablet Take 1 tablet (20 mg total) by mouth 2 (two) times daily for 5 days. 06/09/23 06/14/23 Yes Bing Neighbors, NP  promethazine-dextromethorphan (PROMETHAZINE-DM) 6.25-15 MG/5ML syrup Take 5 mLs by mouth 3 (three) times daily as needed for cough. 06/09/23  Yes Bing Neighbors, NP  amoxicillin-clavulanate (AUGMENTIN) 875-125 MG tablet Take 1 tablet by mouth every 12 (twelve) hours. 06/01/19   Janace Aris, FNP  cyclobenzaprine (FLEXERIL) 10 MG tablet Take 1 tablet (10 mg total) by mouth 2 (two) times daily as needed for muscle spasms. 11/13/19   Moshe Cipro, FNP  doxycycline (VIBRAMYCIN) 100 MG capsule Take 1 capsule (100 mg total) by mouth 2 (two) times daily. 03/01/22   Elson Areas, PA-C  ibuprofen (ADVIL) 600 MG tablet Take 1  tablet (600 mg total) by mouth every 6 (six) hours as needed. 11/13/19   Moshe Cipro, FNP  levonorgestrel (MIRENA) 20 MCG/24HR IUD 1 each by Intrauterine route once.    [provider]    Family History No family history on file.  Social History Social History   Tobacco Use   Smoking status: Passive Smoke Exposure - Never Smoker   Smokeless tobacco: Never  Substance Use Topics   Alcohol use: Yes    Comment: occasionally   Drug use: No     Allergies   Patient has no active allergies.   Review of Systems Review of Systems  Constitutional:  Positive for fever.  Respiratory:  Positive for cough.   Neurological:  Positive for headaches.     Physical Exam Triage Vital Signs ED Triage Vitals  Encounter Vitals Group     BP 06/09/23 1251 128/86     Systolic BP Percentile --      Diastolic BP Percentile --      Pulse Rate 06/09/23 1251 (!) 103     Resp 06/09/23 1251 16     Temp 06/09/23 1251 98.6 F (37 C)     Temp Source 06/09/23 1251 Oral     SpO2 06/09/23 1251 96 %     Weight --      Height --      Head Circumference --      Peak Flow --      Pain  Score 06/09/23 1249 7     Pain Loc --      Pain Education --      Exclude from Growth Chart --    No data found.  Updated Vital Signs BP 128/86 (BP Location: Left Arm)   Pulse (!) 103   Temp 98.6 F (37 C) (Oral)   Resp 16   LMP 05/06/2023 (Approximate)   SpO2 96%   Visual Acuity Right Eye Distance:   Left Eye Distance:   Bilateral Distance:    Right Eye Near:   Left Eye Near:    Bilateral Near:     Physical Exam Vitals reviewed.  Constitutional:      Appearance: She is well-developed.  HENT:     Head: Normocephalic and atraumatic.  Eyes:     Extraocular Movements: Extraocular movements intact.     Pupils: Pupils are equal, round, and reactive to light.  Cardiovascular:     Rate and Rhythm: Regular rhythm. Tachycardia present.  Pulmonary:     Effort: Pulmonary effort is normal.      Breath sounds: Wheezing, rhonchi and rales present.  Musculoskeletal:     Cervical back: Normal range of motion and neck supple.  Lymphadenopathy:     Cervical: Cervical adenopathy present.  Neurological:     Mental Status: She is alert and oriented to person, place, and time.     GCS: GCS eye subscore is 4. GCS verbal subscore is 5. GCS motor subscore is 6.  Psychiatric:        Mood and Affect: Mood normal.        Behavior: Behavior normal.      UC Treatments / Results  Labs (all labs ordered are listed, but only abnormal results are displayed) Labs Reviewed  POC COVID19/FLU A&B COMBO - Normal    EKG   Radiology No results found.  Procedures Procedures (including critical care time)  Medications Ordered in UC Medications  albuterol (VENTOLIN HFA) 108 (90 Base) MCG/ACT inhaler 2 puff (2 puffs Inhalation Given 06/09/23 1333)    Initial Impression / Assessment and Plan / UC Course  I have reviewed the triage vital signs and the nursing notes.  Pertinent labs & imaging results that were available during my care of the patient were reviewed by me and considered in my medical decision making (see chart for details).    Treating for an acute lower respiratory tract infection, for possible pneumonia however imaging is not available onsite today.  Covering empirically with Augmentin, prednisone and Promethazine DM for cough.  Albuterol inhaler 2 puffs every 4-6 hours as needed for wheezing, chest tightness.  Strict return precautions given if symptoms worsen or do not improve. Final Clinical Impressions(s) / UC Diagnoses   Final diagnoses:  Acute lower respiratory tract infection     Discharge Instructions      COVID and flu test both negative.  Start prednisone 20 mg twice daily for 5 days for inflammation of your chest.  Augmentin twice daily for 7 days this is to cover you for what I suspect is likely a pneumonia.  Promethazine DM for cough.  Albuterol inhaler 2  puffs every 4-6 hours as needed for chest tightness, shortness of breath or wheezing.  For body aches and chills Tylenol or ibuprofen as needed     ED Prescriptions     Medication Sig Dispense Auth. Provider   amoxicillin-clavulanate (AUGMENTIN) 875-125 MG tablet Take 1 tablet by mouth every 12 (twelve) hours. 14 tablet Joaquin Courts  S, NP   predniSONE (DELTASONE) 20 MG tablet Take 1 tablet (20 mg total) by mouth 2 (two) times daily for 5 days. 10 tablet Bing Neighbors, NP   promethazine-dextromethorphan (PROMETHAZINE-DM) 6.25-15 MG/5ML syrup Take 5 mLs by mouth 3 (three) times daily as needed for cough. 180 mL Bing Neighbors, NP      PDMP not reviewed this encounter.   Bing Neighbors, NP 06/09/23 [phone]

## 2023-06-09 NOTE — Discharge Instructions (Addendum)
 COVID and flu test both negative.  Start prednisone 20 mg twice daily for 5 days for inflammation of your chest.  Augmentin twice daily for 7 days this is to cover you for what I suspect is likely a pneumonia.  Promethazine DM for cough.  Albuterol inhaler 2 puffs every 4-6 hours as needed for chest tightness, shortness of breath or wheezing.  For body aches and chills Tylenol or ibuprofen as needed
